# Patient Record
Sex: Female | Born: 1965 | Race: White | Hispanic: No | Marital: Married | Smoking: Former smoker
Health system: Southern US, Community
[De-identification: ages and names within clinical notes are randomized; demographics above are authoritative.]

## PROBLEM LIST (undated history)

## (undated) DIAGNOSIS — N920 Excessive and frequent menstruation with regular cycle: Secondary | ICD-10-CM

## (undated) DIAGNOSIS — D259 Leiomyoma of uterus, unspecified: Secondary | ICD-10-CM

## (undated) HISTORY — PX: TUBAL LIGATION: SHX77

## (undated) HISTORY — PX: DILATION AND CURETTAGE OF UTERUS: SHX78

---

## 1999-05-02 ENCOUNTER — Inpatient Hospital Stay (HOSPITAL_COMMUNITY): Admission: AD | Admit: 1999-05-02 | Discharge: 1999-05-05 | Payer: Self-pay | Admitting: Obstetrics and Gynecology

## 2002-05-13 ENCOUNTER — Ambulatory Visit (HOSPITAL_COMMUNITY): Admission: RE | Admit: 2002-05-13 | Discharge: 2002-05-13 | Payer: Self-pay | Admitting: Obstetrics and Gynecology

## 2002-09-27 ENCOUNTER — Other Ambulatory Visit: Admission: RE | Admit: 2002-09-27 | Discharge: 2002-09-27 | Payer: Self-pay | Admitting: Obstetrics and Gynecology

## 2003-10-11 ENCOUNTER — Other Ambulatory Visit: Admission: RE | Admit: 2003-10-11 | Discharge: 2003-10-11 | Payer: Self-pay | Admitting: Obstetrics and Gynecology

## 2014-03-15 ENCOUNTER — Other Ambulatory Visit: Payer: Self-pay | Admitting: Obstetrics and Gynecology

## 2014-03-16 LAB — CYTOLOGY - PAP

## 2015-10-24 NOTE — H&P (Signed)
Julie Moore  DICTATION # [id number] CSN# [id number]   Margarette Asal, MD 10/24/2015 1:43 PM

## 2015-10-26 NOTE — H&P (Signed)
NAMENARDIA, GAJEWSKI                 ACCOUNT NO.:  [id number]  MEDICAL RECORD NO.:  [id number]  LOCATION:                                 FACILITY:  PHYSICIAN:  Ralene Bathe. Matthew Saras, M.D.DATE OF BIRTH:  19-Feb-1965  DATE OF ADMISSION: DATE OF DISCHARGE:                             HISTORY & PHYSICAL   CHIEF COMPLAINT:  Persistent menorrhagia, leiomyoma.  HISTORY OF PRESENT ILLNESS:  A 50 year old, G1, P1, prior tubal, presented with a 1 year history of heavy bleeding, underwent in May 2017 a D and C, hysteroscopy with resection of a submucous leiomyoma.  This was resected down to the surrounding level, but unfortunately significant portion that was still intramural.  She has continued to have significant problematic bleeding.  Followup ultrasound on September 2017 still showed a 2.3 x 2.1 submucosal fibroid with areas of degeneration, presents now for definitive LAVH, bilateral salpingectomy. This procedure including specific risks related to bleeding, infection, transfusion, adjacent organ injury, wound infection, phlebitis expected recovery time, the possible need to complete surgery by open technique, all discussed with her, which she understands and accepts.  PAST MEDICAL HISTORY:  ALLERGIES:  None.  CURRENT MEDICATIONS:  Over-the-counter supplements p.r.n.  OBSTETRICAL AND SURGICAL HISTORY:  She has had a tubal ligation, 1 vaginal delivery.  FAMILY HISTORY:  Significant for hypertension only.  SOCIAL HISTORY:  Denies alcohol, tobacco, or drug use.  She is married. Her medical doctor is Sadie Haber, PCP at [facility].  Last Pap March, 2017, was normal.  PHYSICAL EXAMINATION:  VITAL SIGNS:  Temp 98.2, blood pressure 120/72. HEENT:  Unremarkable. NECK:  Supple.  No masses. LUNGS:  Clear. CARDIOVASCULAR:  Regular rate and rhythm without murmurs, rubs, gallops. BREASTS:  Without masses. ABDOMEN:  Soft, flat, nontender.  Vulva, vagina, cervix normal.  Uterus upper limit  normal size.  Adnexa negative.  Uterus was mobile. EXTREMITIES:  Unremarkable. NEUROLOGIC:  Unremarkable.  IMPRESSION:  Persistent menorrhagia secondary to submucous/intramural leiomyoma.  PLAN:  LAVH, bilateral salpingectomy.  Procedure and risks discussed as above.     Veneda Kirksey M. Matthew Saras, M.D.   ______________________________ Ralene Bathe. Matthew Saras, M.D.    [facility]/MEDQ  D:  10/24/2015  T:  10/25/2015  Job:  [id number]

## 2015-11-21 ENCOUNTER — Encounter (HOSPITAL_BASED_OUTPATIENT_CLINIC_OR_DEPARTMENT_OTHER): Payer: Self-pay | Admitting: *Deleted

## 2015-11-21 NOTE — Progress Notes (Signed)
Spoke w Nira Conn, OR scheduler @ [facility].  Requested order clarification regarding urine hcg; pt had btl in 2005.

## 2015-11-21 NOTE — Progress Notes (Signed)
Received call from [facility]. Pt will need urine hcg on arrival for confirmation.

## 2015-11-21 NOTE — Progress Notes (Signed)
Pt instructed npo pmn 10/30.  To [facility] 10/31 @ [id number].  Needs cbc, T&S urine hcg on arrival. Pt aware that she will be staying overnite @ [facility].

## 2015-11-26 NOTE — Anesthesia Preprocedure Evaluation (Addendum)
Anesthesia Evaluation  Patient identified by MRN, date of birth, ID band Patient awake    Reviewed: Allergy & Precautions, H&P , Patient's Chart, lab work & pertinent test results, reviewed documented beta blocker date and time   History of Anesthesia Complications (+) DIFFICULT AIRWAY and history of anesthetic complications (This note is a 11-27-15 post op note: She was a difficult Intubation. Use glidescope in futue)  Airway Mallampati: II  TM Distance: >3 FB Neck ROM: full  Mouth opening: Limited Mouth Opening  Dental no notable dental hx.    Pulmonary former smoker,    Pulmonary exam normal breath sounds clear to auscultation       Cardiovascular  Rhythm:regular Rate:Normal     Neuro/Psych    GI/Hepatic   Endo/Other    Renal/GU      Musculoskeletal   Abdominal   Peds  Hematology   Anesthesia Other Findings   Reproductive/Obstetrics                           Anesthesia Physical Anesthesia Plan  ASA: II  Anesthesia Plan: General   Post-op Pain Management:    Induction: Intravenous  Airway Management Planned: Oral ETT  Additional Equipment:   Intra-op Plan:   Post-operative Plan: Extubation in OR  Informed Consent: I have reviewed the patients History and Physical, chart, labs and discussed the procedure including the risks, benefits and alternatives for the proposed anesthesia with the patient or authorized representative who has indicated his/her understanding and acceptance.   Dental Advisory Given and Dental advisory given  Plan Discussed with: CRNA and Surgeon  Anesthesia Plan Comments: (Last GA ?? Tubal??   Small aperture of mouth noted and discussed with patient  Discussed general anesthesia, including possible nausea, instrumentation of airway, sore throat,pulmonary aspiration, etc. I asked if the were any outstanding questions, or  concerns before we proceeded. )        Anesthesia Quick Evaluation

## 2015-11-27 ENCOUNTER — Encounter (HOSPITAL_COMMUNITY)
Admission: RE | Disposition: A | Discharge status: Home / Self Care | Payer: Self-pay | Source: Ambulatory Visit | Attending: Obstetrics and Gynecology

## 2015-11-27 ENCOUNTER — Ambulatory Visit (HOSPITAL_BASED_OUTPATIENT_CLINIC_OR_DEPARTMENT_OTHER): Payer: 59 | Admitting: Anesthesiology

## 2015-11-27 ENCOUNTER — Encounter (HOSPITAL_BASED_OUTPATIENT_CLINIC_OR_DEPARTMENT_OTHER): Payer: Self-pay | Admitting: *Deleted

## 2015-11-27 ENCOUNTER — Observation Stay (HOSPITAL_BASED_OUTPATIENT_CLINIC_OR_DEPARTMENT_OTHER)
Admission: RE | Admit: 2015-11-27 | Discharge: 2015-11-28 | Disposition: A | Discharge status: Home / Self Care | Payer: 59 | Source: Ambulatory Visit | Attending: Obstetrics and Gynecology | Admitting: Obstetrics and Gynecology

## 2015-11-27 DIAGNOSIS — N72 Inflammatory disease of cervix uteri: Secondary | ICD-10-CM | POA: Diagnosis not present

## 2015-11-27 DIAGNOSIS — N838 Other noninflammatory disorders of ovary, fallopian tube and broad ligament: Secondary | ICD-10-CM | POA: Diagnosis not present

## 2015-11-27 DIAGNOSIS — D251 Intramural leiomyoma of uterus: Secondary | ICD-10-CM | POA: Diagnosis not present

## 2015-11-27 DIAGNOSIS — Z87891 Personal history of nicotine dependence: Secondary | ICD-10-CM | POA: Diagnosis not present

## 2015-11-27 DIAGNOSIS — D250 Submucous leiomyoma of uterus: Secondary | ICD-10-CM | POA: Diagnosis not present

## 2015-11-27 DIAGNOSIS — N920 Excessive and frequent menstruation with regular cycle: Principal | ICD-10-CM | POA: Diagnosis present

## 2015-11-27 HISTORY — DX: Leiomyoma of uterus, unspecified: D25.9

## 2015-11-27 HISTORY — DX: Excessive and frequent menstruation with regular cycle: N92.0

## 2015-11-27 HISTORY — PX: LAPAROSCOPIC VAGINAL HYSTERECTOMY WITH SALPINGECTOMY: SHX6680

## 2015-11-27 LAB — CBC
HEMATOCRIT: 38.2 % (ref 36.0–46.0)
HEMOGLOBIN: 13 g/dL (ref 12.0–15.0)
MCH: 30 pg (ref 26.0–34.0)
MCHC: 34 g/dL (ref 30.0–36.0)
MCV: 88.2 fL (ref 78.0–100.0)
Platelets: 219 10*3/uL (ref 150–400)
RBC: 4.33 MIL/uL (ref 3.87–5.11)
RDW: 13.2 % (ref 11.5–15.5)
WBC: 7.5 10*3/uL (ref 4.0–10.5)

## 2015-11-27 LAB — POCT PREGNANCY, URINE
PREG TEST UR: NEGATIVE
Preg Test, Ur: NEGATIVE

## 2015-11-27 LAB — TYPE AND SCREEN
ABO/RH(D): A POS
Antibody Screen: NEGATIVE

## 2015-11-27 LAB — ABO/RH: ABO/RH(D): A POS

## 2015-11-27 SURGERY — HYSTERECTOMY, VAGINAL, LAPAROSCOPY-ASSISTED, WITH SALPINGECTOMY
Anesthesia: General | Site: Abdomen | Laterality: Bilateral

## 2015-11-27 MED ORDER — ROCURONIUM BROMIDE 50 MG/5ML IV SOSY
PREFILLED_SYRINGE | INTRAVENOUS | Status: DC | PRN
  Administered 2015-11-27: 10 mg via INTRAVENOUS
  Administered 2015-11-27: 40 mg via INTRAVENOUS

## 2015-11-27 MED ORDER — DEXTROSE IN LACTATED RINGERS 5 % IV SOLN
INTRAVENOUS | Status: DC
  Administered 2015-11-27 (×2): via INTRAVENOUS
  Administered 2015-11-28: 125 mL/h via INTRAVENOUS
  Filled 2015-11-27: qty 1000

## 2015-11-27 MED ORDER — MIDAZOLAM HCL 5 MG/5ML IJ SOLN
INTRAMUSCULAR | Status: DC | PRN
  Administered 2015-11-27: 2 mg via INTRAVENOUS

## 2015-11-27 MED ORDER — BUPIVACAINE HCL 0.25 % IJ SOLN
INTRAMUSCULAR | Status: DC | PRN
  Administered 2015-11-27: 8 mL

## 2015-11-27 MED ORDER — CEFOTETAN DISODIUM-DEXTROSE 2-2.08 GM-% IV SOLR
INTRAVENOUS | Status: AC
  Filled 2015-11-27: qty 50

## 2015-11-27 MED ORDER — SUGAMMADEX SODIUM 200 MG/2ML IV SOLN
INTRAVENOUS | Status: AC
  Filled 2015-11-27: qty 2

## 2015-11-27 MED ORDER — ONDANSETRON HCL 4 MG/2ML IJ SOLN: 4.0000 mg | Freq: Four times a day (QID) | INTRAMUSCULAR | Status: DC | PRN

## 2015-11-27 MED ORDER — DEXAMETHASONE SODIUM PHOSPHATE 4 MG/ML IJ SOLN
INTRAMUSCULAR | Status: DC | PRN
  Administered 2015-11-27: 10 mg via INTRAVENOUS

## 2015-11-27 MED ORDER — MORPHINE SULFATE 2 MG/ML IV SOLN: INTRAVENOUS | Status: DC

## 2015-11-27 MED ORDER — ROCURONIUM BROMIDE 50 MG/5ML IV SOSY
PREFILLED_SYRINGE | INTRAVENOUS | Status: AC
  Filled 2015-11-27: qty 5

## 2015-11-27 MED ORDER — DEXTROSE 5 % IV SOLN
2.0000 g | INTRAVENOUS | Status: AC
  Administered 2015-11-27: 2 g via INTRAVENOUS
  Filled 2015-11-27: qty 2

## 2015-11-27 MED ORDER — INFLUENZA VAC SPLIT QUAD 0.5 ML IM SUSY
0.5000 mL | PREFILLED_SYRINGE | INTRAMUSCULAR | Status: AC
  Administered 2015-11-28: 0.5 mL via INTRAMUSCULAR
  Filled 2015-11-27: qty 0.5

## 2015-11-27 MED ORDER — OXYCODONE-ACETAMINOPHEN 5-325 MG PO TABS
1.0000 | ORAL_TABLET | ORAL | Status: DC | PRN
  Administered 2015-11-27 – 2015-11-28 (×3): 1 via ORAL
  Filled 2015-11-27 (×3): qty 1
  Filled 2015-11-27: qty 2

## 2015-11-27 MED ORDER — PROPOFOL 10 MG/ML IV BOLUS
INTRAVENOUS | Status: AC
  Filled 2015-11-27: qty 40

## 2015-11-27 MED ORDER — DEXAMETHASONE SODIUM PHOSPHATE 10 MG/ML IJ SOLN
INTRAMUSCULAR | Status: AC
  Filled 2015-11-27: qty 1

## 2015-11-27 MED ORDER — ONDANSETRON HCL 4 MG PO TABS
4.0000 mg | ORAL_TABLET | Freq: Four times a day (QID) | ORAL | Status: DC | PRN
  Filled 2015-11-27: qty 1

## 2015-11-27 MED ORDER — ACETAMINOPHEN 500 MG PO TABS
ORAL_TABLET | ORAL | Status: AC
  Filled 2015-11-27: qty 2

## 2015-11-27 MED ORDER — MENTHOL 3 MG MT LOZG
1.0000 | LOZENGE | OROMUCOSAL | Status: DC | PRN
  Filled 2015-11-27: qty 9

## 2015-11-27 MED ORDER — ONDANSETRON HCL 4 MG/2ML IJ SOLN
INTRAMUSCULAR | Status: DC | PRN
  Administered 2015-11-27: 4 mg via INTRAVENOUS

## 2015-11-27 MED ORDER — DIPHENHYDRAMINE HCL 50 MG/ML IJ SOLN: 12.5000 mg | Freq: Four times a day (QID) | INTRAMUSCULAR | Status: DC | PRN

## 2015-11-27 MED ORDER — HYDROMORPHONE HCL 1 MG/ML IJ SOLN
0.2500 mg | INTRAMUSCULAR | Status: DC | PRN
  Administered 2015-11-27 (×2): 0.5 mg via INTRAVENOUS
  Filled 2015-11-27: qty 0.5

## 2015-11-27 MED ORDER — IBUPROFEN 800 MG PO TABS
800.0000 mg | ORAL_TABLET | Freq: Three times a day (TID) | ORAL | Status: DC | PRN
  Administered 2015-11-27: 800 mg via ORAL
  Filled 2015-11-27 (×2): qty 1

## 2015-11-27 MED ORDER — DIPHENHYDRAMINE HCL 12.5 MG/5ML PO ELIX: 12.5000 mg | ORAL_SOLUTION | Freq: Four times a day (QID) | ORAL | Status: DC | PRN

## 2015-11-27 MED ORDER — FENTANYL CITRATE (PF) 100 MCG/2ML IJ SOLN
INTRAMUSCULAR | Status: DC | PRN
  Administered 2015-11-27 (×4): 50 ug via INTRAVENOUS

## 2015-11-27 MED ORDER — PROPOFOL 10 MG/ML IV BOLUS
INTRAVENOUS | Status: DC | PRN
  Administered 2015-11-27: 200 mg via INTRAVENOUS
  Administered 2015-11-27: 80 mg via INTRAVENOUS

## 2015-11-27 MED ORDER — SODIUM CHLORIDE 0.9% FLUSH: 9.0000 mL | INTRAVENOUS | Status: DC | PRN

## 2015-11-27 MED ORDER — KETOROLAC TROMETHAMINE 30 MG/ML IJ SOLN
INTRAMUSCULAR | Status: AC
  Filled 2015-11-27: qty 1

## 2015-11-27 MED ORDER — ONDANSETRON HCL 4 MG/2ML IJ SOLN
INTRAMUSCULAR | Status: AC
  Filled 2015-11-27: qty 2

## 2015-11-27 MED ORDER — LIDOCAINE 2% (20 MG/ML) 5 ML SYRINGE
INTRAMUSCULAR | Status: AC
  Filled 2015-11-27: qty 5

## 2015-11-27 MED ORDER — HYDROMORPHONE HCL 1 MG/ML IJ SOLN
INTRAMUSCULAR | Status: AC
  Filled 2015-11-27: qty 1

## 2015-11-27 MED ORDER — FENTANYL CITRATE (PF) 100 MCG/2ML IJ SOLN
INTRAMUSCULAR | Status: AC
  Filled 2015-11-27: qty 4

## 2015-11-27 MED ORDER — ACETAMINOPHEN 325 MG PO TABS
ORAL_TABLET | ORAL | Status: DC | PRN
  Administered 2015-11-27: 1000 mg via ORAL

## 2015-11-27 MED ORDER — BUTORPHANOL TARTRATE 1 MG/ML IJ SOLN
1.0000 mg | INTRAMUSCULAR | Status: DC | PRN
  Filled 2015-11-27: qty 2

## 2015-11-27 MED ORDER — MIDAZOLAM HCL 2 MG/2ML IJ SOLN
INTRAMUSCULAR | Status: AC
  Filled 2015-11-27: qty 2

## 2015-11-27 MED ORDER — LACTATED RINGERS IR SOLN
Status: DC | PRN
  Administered 2015-11-27: 3000 mL

## 2015-11-27 MED ORDER — SUGAMMADEX SODIUM 200 MG/2ML IV SOLN
INTRAVENOUS | Status: DC | PRN
  Administered 2015-11-27: 200 mg via INTRAVENOUS

## 2015-11-27 MED ORDER — FENTANYL CITRATE (PF) 100 MCG/2ML IJ SOLN
INTRAMUSCULAR | Status: AC
  Filled 2015-11-27: qty 2

## 2015-11-27 MED ORDER — LACTATED RINGERS IV SOLN
INTRAVENOUS | Status: DC
  Administered 2015-11-27 (×2): via INTRAVENOUS
  Filled 2015-11-27: qty 1000

## 2015-11-27 MED ORDER — KETOROLAC TROMETHAMINE 30 MG/ML IJ SOLN
30.0000 mg | Freq: Once | INTRAMUSCULAR | Status: DC
  Filled 2015-11-27: qty 1

## 2015-11-27 MED ORDER — KETOROLAC TROMETHAMINE 30 MG/ML IJ SOLN
30.0000 mg | Freq: Four times a day (QID) | INTRAMUSCULAR | Status: DC
  Filled 2015-11-27: qty 1

## 2015-11-27 MED ORDER — KETOROLAC TROMETHAMINE 30 MG/ML IJ SOLN
INTRAMUSCULAR | Status: DC | PRN
  Administered 2015-11-27: 30 mg via INTRAVENOUS

## 2015-11-27 MED ORDER — LIDOCAINE 2% (20 MG/ML) 5 ML SYRINGE
INTRAMUSCULAR | Status: DC | PRN
  Administered 2015-11-27: 100 mg via INTRAVENOUS

## 2015-11-27 MED ORDER — ONDANSETRON HCL 4 MG/2ML IJ SOLN
4.0000 mg | Freq: Four times a day (QID) | INTRAMUSCULAR | Status: DC | PRN
  Filled 2015-11-27: qty 2

## 2015-11-27 MED ORDER — KETOROLAC TROMETHAMINE 30 MG/ML IJ SOLN
30.0000 mg | Freq: Four times a day (QID) | INTRAMUSCULAR | Status: DC
  Administered 2015-11-27: 30 mg via INTRAVENOUS
  Filled 2015-11-27 (×2): qty 1

## 2015-11-27 MED ORDER — NALOXONE HCL 0.4 MG/ML IJ SOLN: 0.4000 mg | INTRAMUSCULAR | Status: DC | PRN

## 2015-11-27 SURGICAL SUPPLY — 82 items
APL SRG 38 LTWT LNG FL B (MISCELLANEOUS) ×1
APPLICATOR ARISTA FLEXITIP XL (MISCELLANEOUS) ×2 IMPLANT
BLADE SURG 10 STRL SS (BLADE) IMPLANT
BLADE SURG 11 STRL SS (BLADE) ×3 IMPLANT
CATH ROBINSON RED A/P 16FR (CATHETERS) ×3 IMPLANT
CLEANER CAUTERY TIP 5X5 PAD (MISCELLANEOUS) ×1 IMPLANT
CLOSURE WOUND 1/4 X3 (GAUZE/BANDAGES/DRESSINGS)
COVER BACK TABLE 60X90IN (DRAPES) ×6 IMPLANT
COVER MAYO STAND STRL (DRAPES) ×6 IMPLANT
DRAPE LG THREE QUARTER DISP (DRAPES) ×3 IMPLANT
DRAPE UNDERBUTTOCKS STRL (DRAPE) ×3 IMPLANT
DRSG OPSITE POSTOP 3X4 (GAUZE/BANDAGES/DRESSINGS) ×2 IMPLANT
DRSG TEGADERM 2-3/8X2-3/4 SM (GAUZE/BANDAGES/DRESSINGS) ×3 IMPLANT
ELECT LIGASURE LONG (ELECTRODE) IMPLANT
ELECT LIGASURE SHORT 9 REUSE (ELECTRODE) ×3 IMPLANT
ELECT REM PT RETURN 9FT ADLT (ELECTROSURGICAL) ×3
ELECTRODE REM PT RTRN 9FT ADLT (ELECTROSURGICAL) ×1 IMPLANT
GAUZE SPONGE 4X4 16PLY XRAY LF (GAUZE/BANDAGES/DRESSINGS) ×2 IMPLANT
GLOVE BIO SURGEON STRL SZ 6.5 (GLOVE) ×4 IMPLANT
GLOVE BIO SURGEON STRL SZ7 (GLOVE) ×9 IMPLANT
GLOVE BIO SURGEONS STRL SZ 6.5 (GLOVE) ×3
GLOVE BIOGEL PI IND STRL 6.5 (GLOVE) IMPLANT
GLOVE BIOGEL PI IND STRL 7.0 (GLOVE) IMPLANT
GLOVE BIOGEL PI IND STRL 7.5 (GLOVE) IMPLANT
GLOVE BIOGEL PI INDICATOR 6.5 (GLOVE) ×2
GLOVE BIOGEL PI INDICATOR 7.0 (GLOVE) ×2
GLOVE BIOGEL PI INDICATOR 7.5 (GLOVE) ×2
GLOVE ECLIPSE 7.0 STRL STRAW (GLOVE) ×4 IMPLANT
GOWN STRL REUS W/ TWL LRG LVL3 (GOWN DISPOSABLE) ×4 IMPLANT
GOWN STRL REUS W/TWL LRG LVL3 (GOWN DISPOSABLE) ×12
HEMOSTAT ARISTA ABSORB 3G PWDR (MISCELLANEOUS) ×2 IMPLANT
HOLDER FOLEY CATH W/STRAP (MISCELLANEOUS) ×3 IMPLANT
KIT ROOM TURNOVER WOR (KITS) ×3 IMPLANT
LIQUID BAND (GAUZE/BANDAGES/DRESSINGS) ×3 IMPLANT
MANIFOLD NEPTUNE II (INSTRUMENTS) IMPLANT
NDL HYPO 25X1 1.5 SAFETY (NEEDLE) ×1 IMPLANT
NDL INSUFFLATION 14GA 120MM (NEEDLE) ×1 IMPLANT
NDL SPNL 22GX3.5 QUINCKE BK (NEEDLE) IMPLANT
NEEDLE HYPO 25X1 1.5 SAFETY (NEEDLE) ×3 IMPLANT
NEEDLE INSUFFLATION 14GA 120MM (NEEDLE) ×3 IMPLANT
NEEDLE SPNL 22GX3.5 QUINCKE BK (NEEDLE) IMPLANT
NS IRRIG 500ML POUR BTL (IV SOLUTION) ×3 IMPLANT
PACK BASIN DAY SURGERY FS (CUSTOM PROCEDURE TRAY) ×3 IMPLANT
PACKING VAGINAL (PACKING) IMPLANT
PAD CLEANER CAUTERY TIP 5X5 (MISCELLANEOUS) ×2
PAD OB MATERNITY 4.3X12.25 (PERSONAL CARE ITEMS) ×3 IMPLANT
PADDING ION DISPOSABLE (MISCELLANEOUS) ×3 IMPLANT
PENCIL BUTTON HOLSTER BLD 10FT (ELECTRODE) ×3 IMPLANT
SEALER TISSUE G2 CVD JAW 45CM (ENDOMECHANICALS) ×3 IMPLANT
SET IRRIG TUBING LAPAROSCOPIC (IRRIGATION / IRRIGATOR) IMPLANT
SHEET LAVH (DRAPES) ×3 IMPLANT
SOLUTION ANTI FOG 6CC (MISCELLANEOUS) ×3 IMPLANT
SOLUTION ELECTROLUBE (MISCELLANEOUS) IMPLANT
SPONGE GAUZE 2X2 8PLY STER LF (GAUZE/BANDAGES/DRESSINGS) ×1
SPONGE GAUZE 2X2 8PLY STRL LF (GAUZE/BANDAGES/DRESSINGS) ×2 IMPLANT
SPONGE GAUZE 4X4 12PLY STER LF (GAUZE/BANDAGES/DRESSINGS) IMPLANT
SPONGE LAP 4X18 X RAY DECT (DISPOSABLE) ×3 IMPLANT
STRIP CLOSURE SKIN 1/4X3 (GAUZE/BANDAGES/DRESSINGS) IMPLANT
SUT MNCRL AB 4-0 PS2 18 (SUTURE) ×3 IMPLANT
SUT MON AB 2-0 CT1 36 (SUTURE) IMPLANT
SUT VIC AB 0 CT1 18XCR BRD8 (SUTURE) ×1 IMPLANT
SUT VIC AB 0 CT1 36 (SUTURE) ×3 IMPLANT
SUT VIC AB 0 CT1 8-18 (SUTURE) ×3
SUT VIC AB 2-0 CT1 (SUTURE) ×3 IMPLANT
SUT VIC AB 2-0 UR6 27 (SUTURE) IMPLANT
SUT VICRYL 0 TIES 12 18 (SUTURE) ×3 IMPLANT
SUT VICRYL 0 UR6 27IN ABS (SUTURE) IMPLANT
SUT VICRYL 4-0 PS2 18IN ABS (SUTURE) IMPLANT
SYR BULB IRRIGATION 50ML (SYRINGE) ×3 IMPLANT
SYR CONTROL 10ML LL (SYRINGE) ×3 IMPLANT
SYRINGE 10CC LL (SYRINGE) ×6 IMPLANT
TOWEL OR 17X24 6PK STRL BLUE (TOWEL DISPOSABLE) ×6 IMPLANT
TRAY DSU PREP LF (CUSTOM PROCEDURE TRAY) ×3 IMPLANT
TRAY FOLEY CATH SILVER 14FR (SET/KITS/TRAYS/PACK) ×3 IMPLANT
TROCAR OPTI TIP 5M 100M (ENDOMECHANICALS) ×3 IMPLANT
TROCAR XCEL DIL TIP R 11M (ENDOMECHANICALS) ×3 IMPLANT
TUBE CONNECTING 12'X1/4 (SUCTIONS) ×2
TUBE CONNECTING 12X1/4 (SUCTIONS) ×4 IMPLANT
TUBING INSUFFLATION 10FT LAP (TUBING) ×3 IMPLANT
WARMER LAPAROSCOPE (MISCELLANEOUS) ×3 IMPLANT
WATER STERILE IRR 500ML POUR (IV SOLUTION) ×1 IMPLANT
YANKAUER SUCT BULB TIP NO VENT (SUCTIONS) ×3 IMPLANT

## 2015-11-27 NOTE — Transfer of Care (Signed)
Immediate Anesthesia Transfer of Care Note  Patient: Julie Moore  Procedure(s) Performed: Procedure(s) with comments: LAPAROSCOPIC ASSISTED VAGINAL HYSTERECTOMY WITH SALPINGECTOMY (Bilateral) - need bed  Patient Location: PACU  Anesthesia Type:General  Level of Consciousness: awake, alert  and oriented  Airway & Oxygen Therapy: Patient Spontanous Breathing and Patient connected to nasal cannula oxygen  Post-op Assessment: Report given to RN  Post vital signs: Reviewed and stable  Last Vitals: 132/86, 88, 10, 99%, 97.6 Vitals:   11/27/15 0606  BP: (!) 147/84  Pulse: 85  Resp: 16  Temp: 36.7 C    Last Pain:  Vitals:   11/27/15 0606  TempSrc: Oral      Patients Stated Pain Goal: 9 ([id number])  Complications: No apparent anesthesia complications

## 2015-11-27 NOTE — Anesthesia Postprocedure Evaluation (Signed)
Anesthesia Post Note  Patient: Julie Moore  Procedure(s) Performed: Procedure(s) (LRB): LAPAROSCOPIC ASSISTED VAGINAL HYSTERECTOMY WITH SALPINGECTOMY (Bilateral)  Comments: DI Note written in chart Easy Mask noted    Last Vitals:  Vitals:   11/27/15 1131 11/27/15 1233  BP: 121/74 113/67  Pulse: 77 76  Resp: 14 16  Temp: 36.7 C 36.7 C    Last Pain:  Vitals:   11/27/15 1233  TempSrc: Oral  PainSc: 1                  Grayden Burley EDWARD

## 2015-11-27 NOTE — Progress Notes (Signed)
The patient was re-examined with no change in status 

## 2015-11-27 NOTE — Op Note (Signed)
Preoperative diagnosis: Leiomyoma, menorrhagia  Postoperative diagnosis: Same  Procedure: LAVH, bilateral salpingectomy  Surgeon: Matthew Saras  Assistant: Morris  EBL: 400 cc  Procedure and findings:  The patient taken the operating room after an adequate level of general anesthesia was obtained with the patient's legs in stirrups the abdomen perineum and vagina were prepped and draped and the bladder was drained. Appropriate timeouts were taken. Uterus was midposition normal size adnexa negative, Hulka tenaculum was positioned. Attention directed to the abdomen where the subumbilical area was infiltrated with quarter percent Marcaine plain, small incision was made in the varies needle was introduced without difficulty. Its intra-abdominal position was verified by pressure water testing. After a 3 L pneumoperitoneum syncopated, lap scopic trocar and sleeve were then introduced that difficulty. There was no evidence of any bleeding or trauma. 3 finger breaths above the symphysis in the midline a 5 mm trocar was inserted under direct visualization with the pelvic findings as follows:  The uterus had a small serosal fibroid but was otherwise normal size cul-de-sac anterior space bilateral adnexa and upper abdomen unremarkable using the Enseal device starting on the right the right tube was grasped at the fimbriated end, the mesosalpinx was divided with the Enseal up to the uterus the same on the opposite side thus both ovaries were conserved which appeared to be normal.  The round ligament was coagulated and divided on each side freeing up the uterus. The vaginal portion the procedure started at this point.  Legs were extended, weighted speculum was positioned cervix grasped with tenaculum was circumscribed with a incision, posterior culdotomy performed without difficulty. The bladder was advanced superiorly with sharp and blunt dissection until the anterior peritoneal reflection could be identified, this  was then entered sharply and a retractor then used to gently elevate the bladder out of the field. In sequential manner, staying close to the uterus the uterosacral ligament cardinal ligament uterine vasculature pedicle and upper broad ligament pedicles were clamped divided and suture ligated. Fundus of the uterus is in delivered posteriorly remaining pedicles were coagulated and divided. Prior to closure sponge, needle, history counts reported as correct 2. The vaginal cuff was then closed from 3 to 9:00 with a running locked 2-0 Vicryl suture. The vaginal mucosa was then closed right to left with interrupted 2-0 Vicryl sutures. This was hemostatic Foley catheter positioned draining clear urine. Repeat laparoscopy was carried out there was a small bleeder at the cuff there was coagulated initially with Enseal, but required bipolar for complete hemostasis once this was accomplished the Nezhat irrigation and suction at reduced pressure revealed the operative site to be hemostatic. There was minimal Oozing, Arista spray was placed across the cuff for further hemostasis inserts removed, gas allowed to escape the incision closed the upper one with 4-0 Monocryl subcuticular and Dermabond on the lower she tolerated this well went to recovery room in good condition  Dictated with Acres Green M.D.

## 2015-11-27 NOTE — Anesthesia Procedure Notes (Addendum)
Procedure Name: Intubation Date/Time: 11/27/2015 7:38 AM Performed by: Bethena Roys T Pre-anesthesia Checklist: Patient identified, Emergency Drugs available, Suction available and Patient being monitored Patient Re-evaluated:Patient Re-evaluated prior to inductionOxygen Delivery Method: Circle system utilized Preoxygenation: Pre-oxygenation with 100% oxygen Intubation Type: IV induction Ventilation: Mask ventilation without difficulty Laryngoscope Size: Mac and 3 Grade View: Grade IV Tube type: Oral Tube size: 7.0 mm Number of attempts: 2 Airway Equipment and Method: Stylet,  Oral airway and Video-laryngoscopy Placement Confirmation: ETT inserted through vocal cords under direct vision,  positive ETCO2 and breath sounds checked- equal and bilateral Tube secured with: Tape Dental Injury: Bloody posterior oropharynx and Injury to tongue  Difficulty Due To: Difficulty was anticipated and Difficult Airway- due to limited oral opening Comments: First attempt with Mac 3, Grade 4 view.  Easy mask, 2nd attempt with glidescope, very small mouth, able to visualize grade 2.  ETT placed with some difficulty to manipulate through cords.  Small cut noted to tip of tongue after placement.  No dental damage noted

## 2015-11-28 ENCOUNTER — Encounter (HOSPITAL_BASED_OUTPATIENT_CLINIC_OR_DEPARTMENT_OTHER): Payer: Self-pay | Admitting: Obstetrics and Gynecology

## 2015-11-28 DIAGNOSIS — N920 Excessive and frequent menstruation with regular cycle: Secondary | ICD-10-CM | POA: Diagnosis not present

## 2015-11-28 LAB — CBC
HEMATOCRIT: 30.3 % — AB (ref 36.0–46.0)
HEMOGLOBIN: 10.4 g/dL — AB (ref 12.0–15.0)
MCH: 30.8 pg (ref 26.0–34.0)
MCHC: 34.3 g/dL (ref 30.0–36.0)
MCV: 89.6 fL (ref 78.0–100.0)
Platelets: 182 10*3/uL (ref 150–400)
RBC: 3.38 MIL/uL — ABNORMAL LOW (ref 3.87–5.11)
RDW: 13.4 % (ref 11.5–15.5)
WBC: 9.6 10*3/uL (ref 4.0–10.5)

## 2015-11-28 MED ORDER — OXYCODONE-ACETAMINOPHEN 5-325 MG PO TABS: 1.0000 | ORAL_TABLET | ORAL | 0 refills | Status: AC | PRN

## 2015-11-28 MED ORDER — IBUPROFEN 800 MG PO TABS: 800.0000 mg | ORAL_TABLET | Freq: Three times a day (TID) | ORAL | 1 refills | Status: AC | PRN

## 2015-11-28 NOTE — Discharge Summary (Signed)
Physician Discharge Summary  Patient ID: Julie Moore MRN: [id number] DOB/AGE: 04-15-1965 50 y.o.  Admit date: 11/27/2015 Discharge date: 11/28/2015  Admission Diagnoses:menorrhagia, leiomyoma  Discharge Diagnoses: same Active Problems:   Menorrhagia   Discharged Condition: good  Hospital Course: adm for LAVH + BS, on POD 1 was afeb, tol PO and ready for D/C  Consults: None  Significant Diagnostic Studies: labs:  CBC    Component Value Date/Time   WBC 9.6 11/28/2015 0451   RBC 3.38 (L) 11/28/2015 0451   HGB 10.4 (L) 11/28/2015 0451   HCT 30.3 (L) 11/28/2015 0451   PLT 182 11/28/2015 0451   MCV 89.6 11/28/2015 0451   MCH 30.8 11/28/2015 0451   MCHC 34.3 11/28/2015 0451   RDW 13.4 11/28/2015 0451     Treatments: surgery: LAVH, bilat salpingectomy  Discharge Exam: Blood pressure 105/67, pulse 78, temperature 98.4 F (36.9 C), temperature source Oral, resp. rate 16, height 5\' 4"  (1.626 m), weight 202 lb (91.6 kg), last menstrual period 11/02/2015, SpO2 99 %. abd soft + BS, incs C/D  Disposition:      Medication List    TAKE these medications   ibuprofen 800 MG tablet Commonly known as:  ADVIL,MOTRIN Take 1 tablet (800 mg total) by mouth every 8 (eight) hours as needed for moderate pain (mild pain).   multivitamin with minerals tablet Take 1 tablet by mouth daily.   oxyCODONE-acetaminophen 5-325 MG tablet Commonly known as:  PERCOCET/ROXICET Take 1-2 tablets by mouth every 4 (four) hours as needed for severe pain (moderate to severe pain (when tolerating fluids)).   SUPER B COMPLEX PO Take 1 tablet by mouth daily.      Follow-up Information    Margarette Asal, MD. Schedule an appointment as soon as possible for a visit in 10 day(s).   Specialty:  Obstetrics and Gynecology Contact information: [address] [place] [address] [phone]           Signed: Margarette Asal 11/28/2015, 9:33 AM

## 2015-11-28 NOTE — Progress Notes (Signed)
RN reviewed discharge instructions with patient and family. All questions answered.   Paperwork and prescriptions given.   NT rolled patient down with all belongings to family car. 

## 2016-05-19 ENCOUNTER — Other Ambulatory Visit: Payer: Self-pay | Admitting: Obstetrics and Gynecology

## 2016-05-19 DIAGNOSIS — R928 Other abnormal and inconclusive findings on diagnostic imaging of breast: Secondary | ICD-10-CM

## 2016-05-23 ENCOUNTER — Ambulatory Visit
Admission: RE | Admit: 2016-05-23 | Discharge: 2016-05-23 | Disposition: A | Discharge status: Home / Self Care | Payer: 59 | Source: Ambulatory Visit | Attending: Obstetrics and Gynecology | Admitting: Obstetrics and Gynecology

## 2016-05-23 ENCOUNTER — Encounter: Payer: Self-pay | Admitting: Radiology

## 2016-05-23 ENCOUNTER — Other Ambulatory Visit: Payer: Self-pay | Admitting: Obstetrics and Gynecology

## 2016-05-23 DIAGNOSIS — R928 Other abnormal and inconclusive findings on diagnostic imaging of breast: Secondary | ICD-10-CM

## 2016-11-24 ENCOUNTER — Other Ambulatory Visit: Payer: 59

## 2016-11-24 ENCOUNTER — Ambulatory Visit
Admission: RE | Admit: 2016-11-24 | Discharge: 2016-11-24 | Disposition: A | Discharge status: Home / Self Care | Payer: 59 | Source: Ambulatory Visit | Attending: Obstetrics and Gynecology | Admitting: Obstetrics and Gynecology

## 2016-11-24 DIAGNOSIS — R928 Other abnormal and inconclusive findings on diagnostic imaging of breast: Secondary | ICD-10-CM

## 2019-11-04 ENCOUNTER — Telehealth: Payer: Self-pay | Admitting: Unknown Physician Specialty

## 2019-11-04 ENCOUNTER — Other Ambulatory Visit: Payer: Self-pay | Admitting: Unknown Physician Specialty

## 2019-11-04 DIAGNOSIS — U071 COVID-19: Secondary | ICD-10-CM

## 2019-11-04 NOTE — Telephone Encounter (Signed)
I connected by phone with Julie Moore on 11/04/2019 at 4:41 PM to discuss the potential use of a new treatment for mild to moderate COVID-19 viral infection in non-hospitalized patients.  This patient is a 54 y.o. female that meets the FDA criteria for Emergency Use Authorization of COVID monoclonal antibody casirivimab/imdevimab or bamlanivimab/eteseviamb.  Has a (+) direct SARS-CoV-2 viral test result  Has mild or moderate COVID-19   Is NOT hospitalized due to COVID-19  Is within 10 days of symptom onset  Has at least one of the high risk factor(s) for progression to severe COVID-19 and/or hospitalization as defined in EUA.  Specific high risk criteria : BMI > 25   I have spoken and communicated the following to the patient or parent/caregiver regarding COVID monoclonal antibody treatment:  1. FDA has authorized the emergency use for the treatment of mild to moderate COVID-19 in adults and pediatric patients with positive results of direct SARS-CoV-2 viral testing who are 81 years of age and older weighing at least 40 kg, and who are at high risk for progressing to severe COVID-19 and/or hospitalization.  2. The significant known and potential risks and benefits of COVID monoclonal antibody, and the extent to which such potential risks and benefits are unknown.  3. Information on available alternative treatments and the risks and benefits of those alternatives, including clinical trials.  4. Patients treated with COVID monoclonal antibody should continue to self-isolate and use infection control measures (e.g., wear mask, isolate, social distance, avoid sharing personal items, clean and disinfect "high touch" surfaces, and frequent handwashing) according to CDC guidelines.   5. The patient or parent/caregiver has the option to accept or refuse COVID monoclonal antibody treatment.  After reviewing this information with the patient, the patient has agreed to receive one of the available  covid 19 monoclonal antibodies and will be provided an appropriate fact sheet prior to infusion. Kathrine Haddock, NP 11/04/2019 4:41 PM Sx onset 9/30

## 2019-11-05 ENCOUNTER — Ambulatory Visit (HOSPITAL_COMMUNITY)
Admission: RE | Admit: 2019-11-05 | Discharge: 2019-11-05 | Disposition: A | Discharge status: Home / Self Care | Payer: 59 | Source: Ambulatory Visit | Attending: Pulmonary Disease | Admitting: Pulmonary Disease

## 2019-11-05 ENCOUNTER — Other Ambulatory Visit (HOSPITAL_COMMUNITY): Payer: Self-pay

## 2019-11-05 DIAGNOSIS — U071 COVID-19: Secondary | ICD-10-CM | POA: Insufficient documentation

## 2019-11-05 MED ORDER — DIPHENHYDRAMINE HCL 50 MG/ML IJ SOLN: 50.0000 mg | Freq: Once | INTRAMUSCULAR | Status: DC | PRN

## 2019-11-05 MED ORDER — ALBUTEROL SULFATE HFA 108 (90 BASE) MCG/ACT IN AERS: 2.0000 | INHALATION_SPRAY | Freq: Once | RESPIRATORY_TRACT | Status: DC | PRN

## 2019-11-05 MED ORDER — FAMOTIDINE IN NACL 20-0.9 MG/50ML-% IV SOLN: 20.0000 mg | Freq: Once | INTRAVENOUS | Status: DC | PRN

## 2019-11-05 MED ORDER — SODIUM CHLORIDE 0.9 % IV SOLN: INTRAVENOUS | Status: DC | PRN

## 2019-11-05 MED ORDER — EPINEPHRINE 0.3 MG/0.3ML IJ SOAJ: 0.3000 mg | Freq: Once | INTRAMUSCULAR | Status: DC | PRN

## 2019-11-05 MED ORDER — METHYLPREDNISOLONE SODIUM SUCC 125 MG IJ SOLR: 125.0000 mg | Freq: Once | INTRAMUSCULAR | Status: DC | PRN

## 2019-11-05 MED ORDER — SODIUM CHLORIDE 0.9 % IV SOLN: Freq: Once | INTRAVENOUS | Status: DC

## 2019-11-05 MED ORDER — IMDEVIMAB (REGN 10987) INJECTION: Freq: Once | INTRAMUSCULAR | Status: AC

## 2019-11-05 NOTE — Discharge Instructions (Signed)

## 2019-11-05 NOTE — Progress Notes (Signed)
  Diagnosis: COVID-19  Physician: Dr. Joya Gaskins  Procedure: [facility] Med: casirivimab\imdevimab infusion - Provided patient with casirivimab\imdevimab fact sheet for patients, parents and caregivers prior to infusion.  Complications: No immediate complications noted.  Discharge: Discharged home   Terence Lux 11/05/2019

## 2020-10-30 ENCOUNTER — Other Ambulatory Visit: Payer: Self-pay | Admitting: Family Medicine

## 2020-10-30 ENCOUNTER — Other Ambulatory Visit: Payer: Self-pay

## 2020-10-30 ENCOUNTER — Other Ambulatory Visit: Payer: Self-pay | Admitting: Physician Assistant

## 2020-10-30 ENCOUNTER — Ambulatory Visit
Admission: RE | Admit: 2020-10-30 | Discharge: 2020-10-30 | Disposition: A | Discharge status: Home / Self Care | Payer: 59 | Source: Ambulatory Visit | Attending: Physician Assistant | Admitting: Physician Assistant

## 2020-10-30 DIAGNOSIS — M25559 Pain in unspecified hip: Secondary | ICD-10-CM

## 2022-01-21 IMAGING — CR DG HIP (WITH OR WITHOUT PELVIS) 2-3V*R*
2 series · 2 of 2 positions shown · non-contrast
Comparison: Pain.

CLINICAL DATA: Anterior right hip/groin pain since June 2020, no
injury or surgery. Hip pain.

EXAM:
DG HIP (WITH OR WITHOUT PELVIS) 2-3V RIGHT

[w hip ap right]
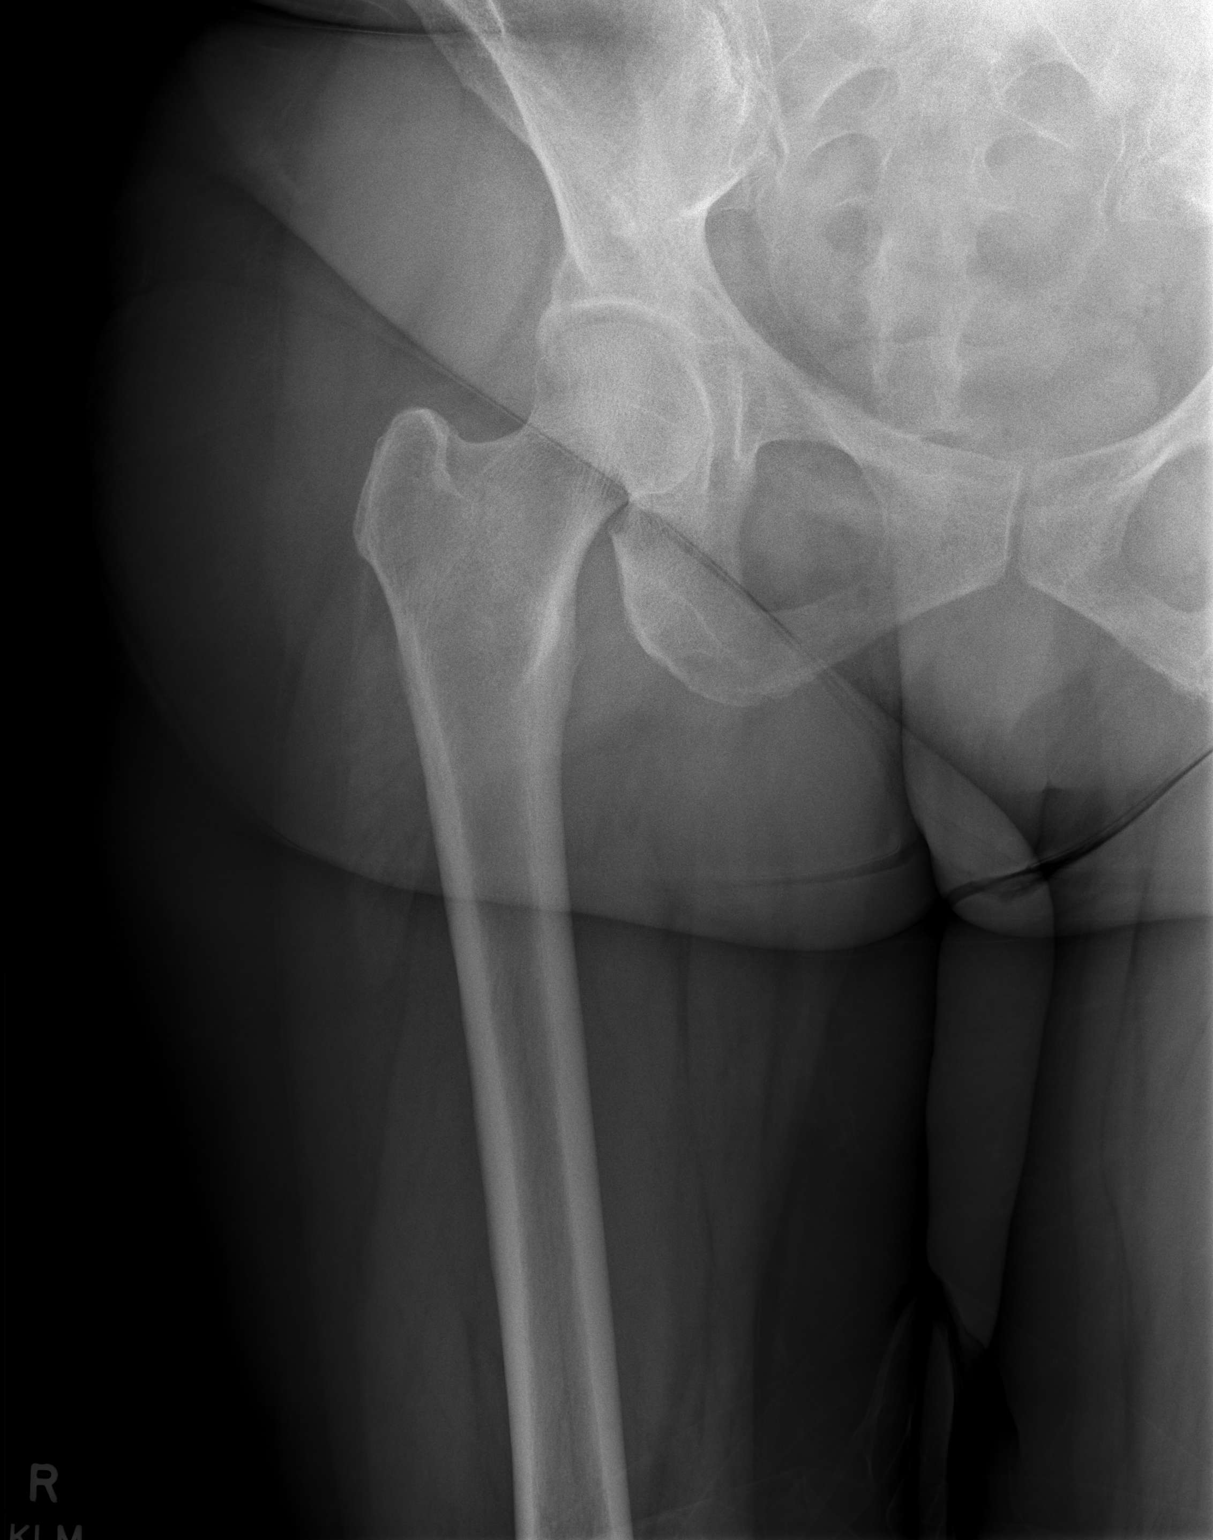

[w hip frog right]
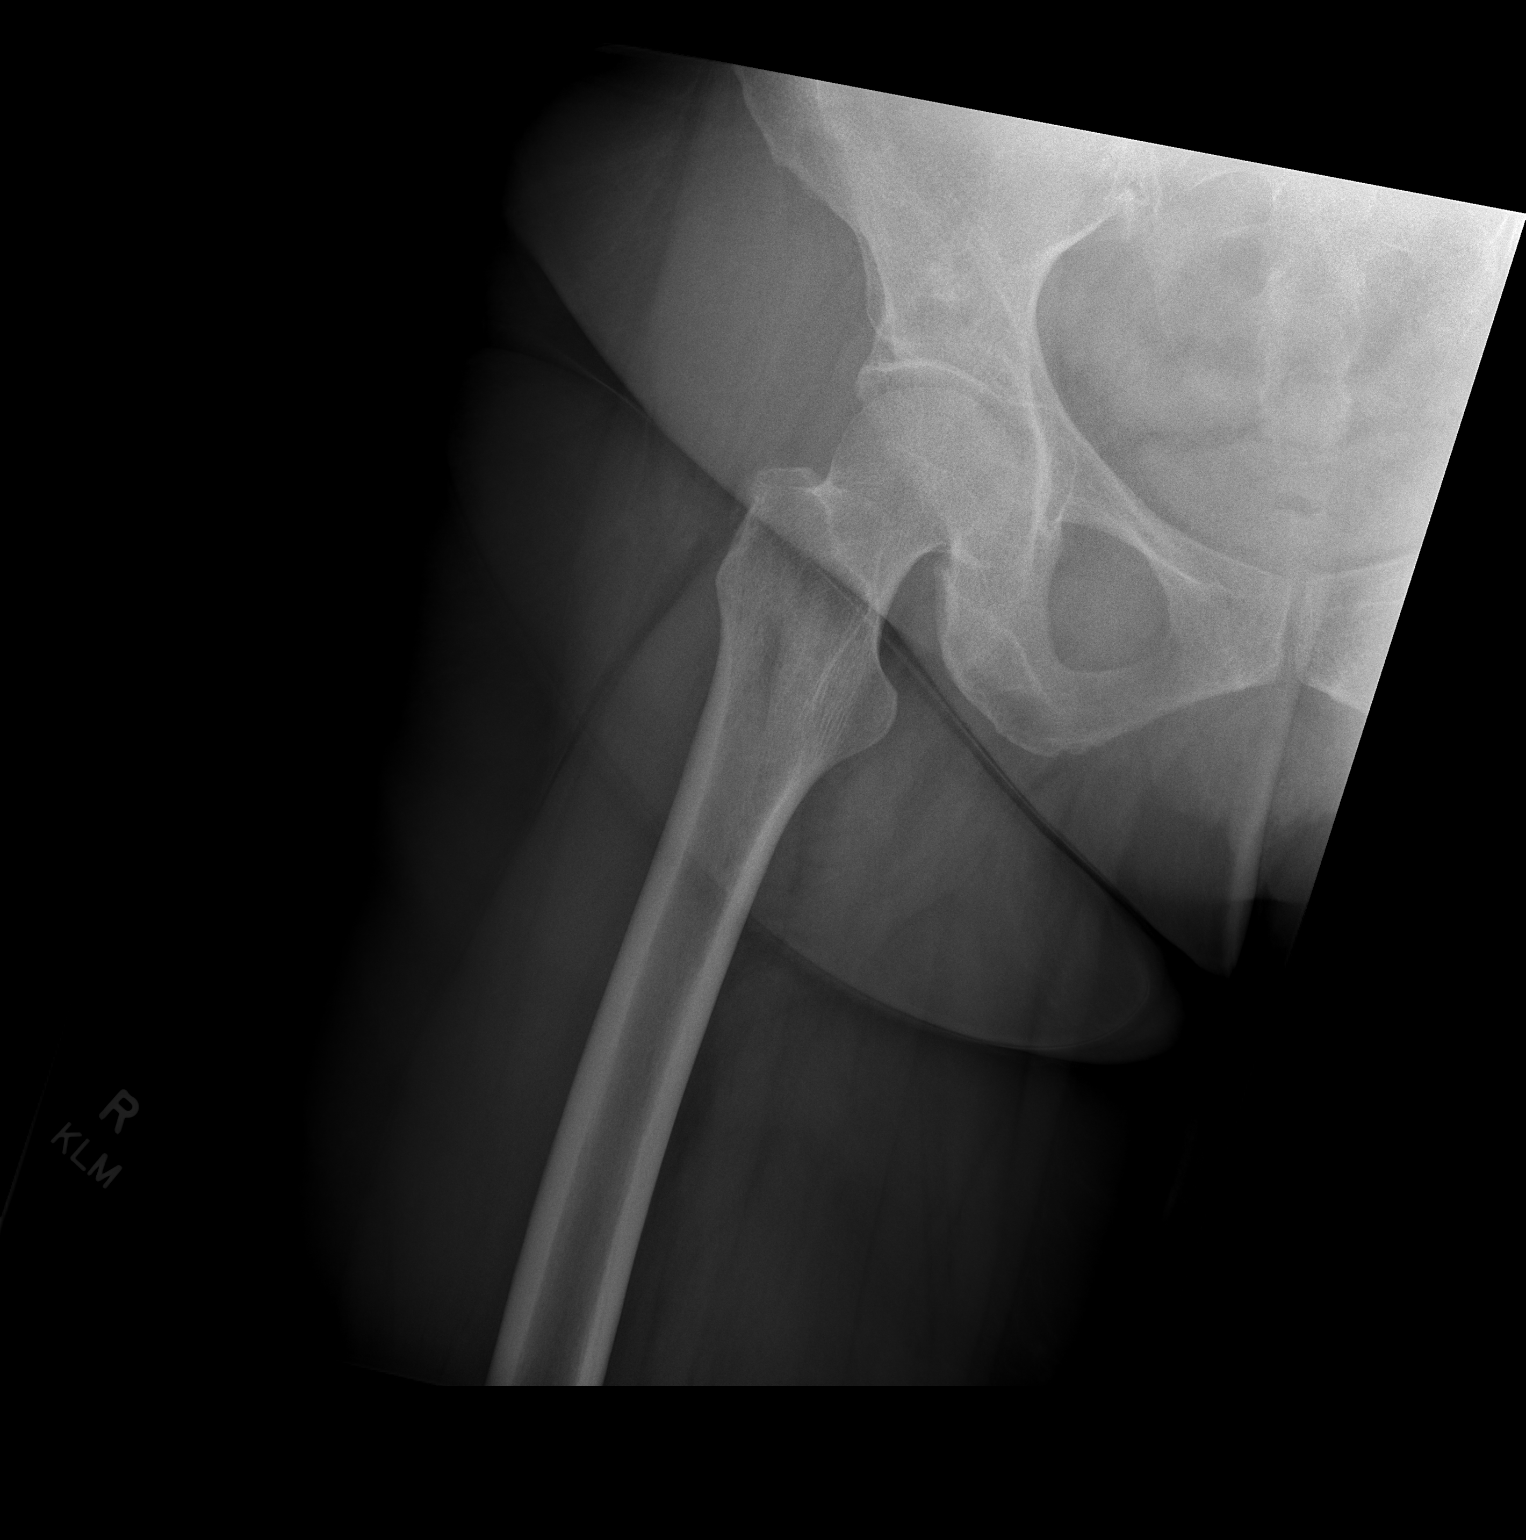

[2 of 2 positions shown; findings below may reference images not displayed]

FINDINGS: There is no evidence of hip fracture or dislocation. There is no
evidence of arthropathy or other focal bone abnormality.
IMPRESSION: Negative.

## 2022-02-06 DIAGNOSIS — J069 Acute upper respiratory infection, unspecified: Secondary | ICD-10-CM | POA: Diagnosis not present

## 2022-03-03 DIAGNOSIS — Z01419 Encounter for gynecological examination (general) (routine) without abnormal findings: Secondary | ICD-10-CM | POA: Diagnosis not present

## 2022-03-03 DIAGNOSIS — Z1231 Encounter for screening mammogram for malignant neoplasm of breast: Secondary | ICD-10-CM | POA: Diagnosis not present

## 2022-03-03 DIAGNOSIS — Z6832 Body mass index (BMI) 32.0-32.9, adult: Secondary | ICD-10-CM | POA: Diagnosis not present

## 2022-03-25 DIAGNOSIS — Z23 Encounter for immunization: Secondary | ICD-10-CM | POA: Diagnosis not present

## 2023-02-12 DIAGNOSIS — E7800 Pure hypercholesterolemia, unspecified: Secondary | ICD-10-CM | POA: Diagnosis not present

## 2023-02-12 DIAGNOSIS — Z0000 Encounter for general adult medical examination without abnormal findings: Secondary | ICD-10-CM | POA: Diagnosis not present

## 2023-02-12 DIAGNOSIS — I10 Essential (primary) hypertension: Secondary | ICD-10-CM | POA: Diagnosis not present

## 2023-02-12 DIAGNOSIS — E559 Vitamin D deficiency, unspecified: Secondary | ICD-10-CM | POA: Diagnosis not present

## 2023-02-12 DIAGNOSIS — Z23 Encounter for immunization: Secondary | ICD-10-CM | POA: Diagnosis not present

## 2023-03-05 DIAGNOSIS — Z1231 Encounter for screening mammogram for malignant neoplasm of breast: Secondary | ICD-10-CM | POA: Diagnosis not present
